# Patient Record
Sex: Male | Born: 1965 | Race: White | Hispanic: No | Marital: Single | Smoking: Current every day smoker
Health system: Southern US, Community
[De-identification: ages and names within clinical notes are randomized; demographics above are authoritative.]

## PROBLEM LIST (undated history)

## (undated) DIAGNOSIS — R569 Unspecified convulsions: Secondary | ICD-10-CM

## (undated) DIAGNOSIS — S0990XA Unspecified injury of head, initial encounter: Secondary | ICD-10-CM

## (undated) HISTORY — DX: Unspecified convulsions: R56.9

## (undated) HISTORY — PX: OTHER SURGICAL HISTORY: SHX169

## (undated) HISTORY — DX: Unspecified injury of head, initial encounter: S09.90XA

## (undated) HISTORY — PX: TONSILLECTOMY: SUR1361

## (undated) HISTORY — PX: HERNIA REPAIR: SHX51

---

## 2000-12-04 ENCOUNTER — Encounter: Payer: Self-pay | Admitting: Dentistry

## 2000-12-05 ENCOUNTER — Ambulatory Visit (HOSPITAL_COMMUNITY): Admission: RE | Admit: 2000-12-05 | Discharge: 2000-12-05 | Payer: Self-pay | Admitting: Dentistry

## 2001-11-05 ENCOUNTER — Encounter: Admission: RE | Admit: 2001-11-05 | Discharge: 2002-02-03 | Payer: Self-pay

## 2002-02-04 ENCOUNTER — Encounter: Admission: RE | Admit: 2002-02-04 | Discharge: 2002-04-02 | Payer: Self-pay | Admitting: Anesthesiology

## 2002-04-23 ENCOUNTER — Encounter: Admission: RE | Admit: 2002-04-23 | Discharge: 2002-07-22 | Payer: Self-pay | Admitting: Anesthesiology

## 2002-08-20 ENCOUNTER — Encounter: Admission: RE | Admit: 2002-08-20 | Discharge: 2002-11-18 | Payer: Self-pay

## 2002-11-25 ENCOUNTER — Encounter
Admission: RE | Admit: 2002-11-25 | Discharge: 2003-02-23 | Payer: Self-pay | Admitting: Physical Medicine & Rehabilitation

## 2003-03-13 ENCOUNTER — Encounter
Admission: RE | Admit: 2003-03-13 | Discharge: 2003-06-11 | Payer: Self-pay | Admitting: Physical Medicine & Rehabilitation

## 2003-07-11 ENCOUNTER — Encounter
Admission: RE | Admit: 2003-07-11 | Discharge: 2003-10-09 | Payer: Self-pay | Admitting: Physical Medicine & Rehabilitation

## 2003-10-20 ENCOUNTER — Encounter
Admission: RE | Admit: 2003-10-20 | Discharge: 2004-01-18 | Payer: Self-pay | Admitting: Physical Medicine & Rehabilitation

## 2006-06-27 ENCOUNTER — Emergency Department (HOSPITAL_COMMUNITY): Admission: EM | Admit: 2006-06-27 | Discharge: 2006-06-27 | Payer: Self-pay | Admitting: Emergency Medicine

## 2010-03-22 ENCOUNTER — Emergency Department (HOSPITAL_COMMUNITY): Admission: EM | Admit: 2010-03-22 | Discharge: 2010-03-22 | Payer: Self-pay | Admitting: Emergency Medicine

## 2010-08-20 ENCOUNTER — Other Ambulatory Visit (HOSPITAL_BASED_OUTPATIENT_CLINIC_OR_DEPARTMENT_OTHER): Payer: Self-pay | Admitting: Internal Medicine

## 2010-08-20 ENCOUNTER — Ambulatory Visit (HOSPITAL_BASED_OUTPATIENT_CLINIC_OR_DEPARTMENT_OTHER)
Admission: RE | Admit: 2010-08-20 | Discharge: 2010-08-20 | Disposition: A | Discharge status: Home / Self Care | Payer: Medicaid Other | Source: Ambulatory Visit | Attending: Internal Medicine | Admitting: Internal Medicine

## 2010-08-20 DIAGNOSIS — R109 Unspecified abdominal pain: Secondary | ICD-10-CM

## 2010-08-20 DIAGNOSIS — R10A1 Flank pain, right side: Secondary | ICD-10-CM

## 2010-09-16 LAB — DIFFERENTIAL
Basophils Absolute: 0 10*3/uL (ref 0.0–0.1)
Basophils Relative: 0 % (ref 0–1)
Eosinophils Absolute: 0.1 10*3/uL (ref 0.0–0.7)
Eosinophils Relative: 2 % (ref 0–5)
Lymphocytes Relative: 23 % (ref 12–46)
Monocytes Absolute: 0.6 10*3/uL (ref 0.1–1.0)

## 2010-09-16 LAB — BASIC METABOLIC PANEL
BUN: 8 mg/dL (ref 6–23)
CO2: 28 mEq/L (ref 19–32)
Calcium: 9.2 mg/dL (ref 8.4–10.5)
Chloride: 104 mEq/L (ref 96–112)
Creatinine, Ser: 0.81 mg/dL (ref 0.4–1.5)
GFR calc Af Amer: >60 mL/min (ref >60)
GFR calc non Af Amer: >60 mL/min (ref >60)
Glucose, Bld: 118 mg/dL — ABNORMAL HIGH (ref 70–99)
Potassium: 4.1 mEq/L (ref 3.5–5.1)
Sodium: 141 mEq/L (ref 135–145)

## 2010-09-16 LAB — CBC
HCT: 45.1 % (ref 39.0–52.0)
MCH: 34.9 pg — ABNORMAL HIGH (ref 26.0–34.0)
MCHC: 34.7 g/dL (ref 30.0–36.0)
MCV: 100.7 fL — ABNORMAL HIGH (ref 78.0–100.0)
Platelets: 253 10*3/uL (ref 150–400)
RDW: 13.9 % (ref 11.5–15.5)
WBC: 7.5 10*3/uL (ref 4.0–10.5)

## 2010-09-16 LAB — ETHANOL: Alcohol, Ethyl (B): 27 mg/dL — ABNORMAL HIGH (ref 0–10)

## 2010-09-16 LAB — RAPID URINE DRUG SCREEN, HOSP PERFORMED
Barbiturates: NOT DETECTED
Benzodiazepines: NOT DETECTED

## 2010-09-16 LAB — TRICYCLICS SCREEN, URINE: TCA Scrn: NOT DETECTED

## 2010-11-19 NOTE — Consult Note (Signed)
NAME:  Brian Riggs, Brian Riggs                           ACCOUNT NO.:  [id number]   MEDICAL RECORD NO.:  [id number]                    PATIENT TYPE:   LOCATION:                                       FACILITY:   PHYSICIAN:  Hans C. Stevphen Rochester, M.D.                DATE OF BIRTH:   DATE OF CONSULTATION:  DATE OF DISCHARGE:                                   CONSULTATION   HISTORY OF PRESENT ILLNESS:  [facility].  I evaluated the health and history form and 14-point  review of systems.  His mother is present.  We sent a considerable period of  time discussing treatment limitations and options, reviewing medications,  risks of medications, proper application, and directed care approach of  medications, and distributed materials.  Nursing is involved in discussions  as well.   1. Originally scheduled for C2 block.  I do think it is a useful procedure     to decrease frequency, intensity and duration of headaches, with anti-     efficacy over occipital nerve block.  Our rationale would be to move to     radiofrequency neuroablation if he had significant improvement.  2. I am going to hold off doing the block as he recently had an inguinal     hernia repair, and manage his pain with Duragesic.  His mother is stating     that he is having difficulty with breakthrough pain.  The Demerol does     not control his pain.  He has to take to the bed, and he has excruciating     pain.  Perhaps a pharmacokinetically longacting drug would be helpful in     this regard and this is reviewed with him.  I want to avoid escalation of     narcotic based pain medication and this might be a reasonable strategy.   Our hopes are eventually to move to non-narcotic medication alternatives or  p.r.n. medication, but I think from a dual purpose, this will help his  inguinal pain, as well as his headache pain which seems to be amplified  after his surgical procedure.  He has no  advancing neurological features, do  not believe further imaging or diagnostics warranted. I do not believe  further consultants are warranted.   OBJECTIVE:  Diffuse paracervical myofascial discomfort, impaired flexion,  extension, lateral rotational pain, positive cervical facetal compression  test left greater than right.  He has no overt neurological deficit, motor,  sensory or reflexes.   IMPRESSION:  1. Cervicalgia.  2. Degenerative spinal disease of cervical spine.  3. Cervical facet syndrome.  4. C2 cephalgia.   PLAN:  As outlined above.  Discharge instructions given.  Review of  Duragesic.  We will see him in one month.  Hans C. Stevphen Rochester, M.D.    [facility]  D:  04/02/2002  T:  04/02/2002  Job:  [id number]

## 2010-11-19 NOTE — Assessment & Plan Note (Signed)
ACUPUNCTURE FOLLOW-UP TREATMENT   SUMMARY:  Current pain level 8/10, going from 5-8. This is the sixth  acupuncture treatment.  Last treatment July 31, 2003.  The patient placed  in supine position.  Needles placed at bilateral LR3, LR8, LR14, as well as  bilateral GB18 and DU20; in addition, GB39 was utilized bilaterally.  Electrical stimulation between LR3 and LR8 as well as between LR14 and GB18.  Electrical stimulation at 150 Hertz x20 minutes was administered.  The  patient tolerated the procedure well.  Post procedure instructions given.  The patient will return in 2 weeks and look at effect of the acupuncture  intervention.      Erick Colace, M.D.   [facility]  D:  08/07/2003 14:08:00  T:  08/07/2003 14:48:44  Job #:  [id number]
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
## 2010-11-19 NOTE — Procedures (Signed)
NAME:  Brian Riggs, Brian Riggs                         ACCOUNT NO.:  [id number]   MEDICAL RECORD NO.:  [id number]                   PATIENT TYPE:  REC   LOCATION:  [facility]                                  FACILITY:  [facility]   PHYSICIAN:  Erick Colace, M.D.           DATE OF BIRTH:  Sep 23, 1965   DATE OF PROCEDURE:  DATE OF DISCHARGE:                                 OPERATIVE REPORT   HISTORY:  [name] returns today for his fourth acupuncture treatment.  He notes a pain level around 7-8/10 this morning.  He took a 1/2 of a  Vicodin.   MEDICATIONS:  1. Gabitril 12 b.i.d.  2. Lidocaine drops 2 drops each nostril t.i.d.  3. Duragesic patch 50 mcg q.72h.  4. Topamax 50 b.i.d.  5. Vicodin 1-1/2 tablets p.o. daily.   TREATMENT:  Treatments today consisted of points bilateral LR3, LR8, LR14,  GB1, as well as GB39.  Electrical stimulation between LR3 and LS14 as well  as between LR14 and GB1.  Treatment time 20 minutes.  Additional point  placed DU20.  The patient tolerated the procedure well. Discussed patient  with his mother.  We will schedule for two more treatments.                                                Erick Colace, M.D.    [facility]  D:  07/28/2003 11:36:50  T:  07/28/2003 12:22:31  Job:  [id number]
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
## 2010-11-19 NOTE — Assessment & Plan Note (Signed)
REASON FOR EVALUATION:  [name] returns after botulinum injection in  bilateral temporalis and posterior cervical musculature.  He had no  beneficial effect from his injection per his report as well as per his  mother's report.  He still continues to have pain mainly at the very top of  his head.  He grades the pain currently as an 8/10, averaging 7.  Interference scores:  Enjoying activity -- 7, mood -- 8, walking ability --  4, __________ -- 7, relationship with other people -- 6, sleep -- 7,  enjoyment of life -- 6.   MEDICATIONS:  Medications include:  1. Amantidine 2 mg t.i.d.  2. Aricept 10 mg nightly.  3. Zoloft 50 mg b.i.d.  4. Gabitril 12 mg b.i.d.  5. Lidocaine 4% -- two drops in each nostril t.i.d.  6. Prilosec 20 mg p.o. daily.  7. Duragesic patch 50 mcg q.72 h.  8. Vicodin one and a half tabs p.o. daily.  9. Topamax 50 mg p.o. b.i.d.   PAST MEDICAL HISTORY:  The patient did reportedly fracture or crack his  left forearm and from the mother's description, it sounds like an ulnar  greenstick-type of fracture; I do not have x-rays or any medical records.  He was treated locally in [place] at [facility].  Other past  medical history is significant for traumatic brain injury and chronic left  hemiparesis.   REVIEW OF SYSTEMS:  No suicidal ideation.  No bowel or bladder incontinence.   EXAMINATION:  GENERAL:  In no acute distress.  VITAL SIGNS:  Blood pressure 120/70, pulse 96, O2 saturation 98%.  NEUROMUSCULAR:  Motor strength is 5/5 in the right deltoid, biceps and grips  as well as hip flexion, knee extension and ankle dorsiflexor.  On the left,  he is 4/5 in these same muscle groups.  Cerebellar testing:  Finger-to-nose-  to-finger intact.  There is mild dysdiadochokinesia in the left upper  extremity supination, pronation and rapid alternating.  Cranial nerves show  an intranuclear ophthalmoplegia, no nystagmus.  Otherwise, cranial nerves  intact.   No tenderness to palpation over the temporal or posterior cervical  musculature.  He has full neck range of motion.   IMPRESSION:  Posttraumatic headaches with history of traumatic brain injury.   RECOMMENDATIONS:  1. Given that botulinum toxin was not helpful, would not benefit from     reinjection.  2. Discussed other alternative treatments including acupuncture.  The     patient's mother is willing to perform this as this is not covered by the     insurance.  I would do a trial of two times a week x3 weeks to evaluate     efficacy.  3. I will schedule this in January.  Continue current medications including     Duragesic 50 mcg q.72 h., continue lidocaine 4% nasal drops two drops,     each nostril, t.i.d.      Erick Colace, M.D.   [facility]  D:  06/09/2003 13:40:47  T:  06/09/2003 15:19:04  Job #:  [id number]   cc:   Sharyne Peach  [address]  [place]  [place] [address]  Fax: [phone]   Ezzard Flax  [address] [place]  [place] [address]  Fax: [phone]   Dr. Maple Hudson

## 2010-11-19 NOTE — Assessment & Plan Note (Signed)
REASON FOR EVALUATION:  Mr. Senske returns today after I last saw him for  acupuncture treatment, August 07, 2003.  He had some minimal improvements  in his headache pain after that puncture treatment.  He had 6 visits.  He  was not able to reduce his p.r.n. medication at all.  He states this pain  goes from 7-9 out of 10.  Pain areas are at the top of the head, right  shoulder and low back.  He has had no new medical complications in the last  2 weeks.  Other modalities tried are botulinum toxin injections, temporalis  as well as posterior cervical musculature on April 28, 2003.  His best  relief has been from sphenopalantine ganglion block that he had x2 in June  of 2004.   CURRENT MEDICATIONS:  1. Duragesic patch 50 mcg q.3 d.  2. __________  1 tab daily and it is 1 mg.  3. Aricept 10 mg p.o. daily.  4. Gabitril 1 p.o. b.i.d.  5. Zoloft 50 mg p.o. b.i.d.  6. Hydrocodone 5/500 mg 1 p.o. daily.  7. Omeprazole 20 mg p.o. daily.  8. He is also on lidocaine drops, 2 drops in each nostril t.i.d.   Duragesic:  He still has 16 days left.  He is current on his hydrocodone  utilization.   REVIEW OF SYSTEMS:  No pain doing down the arms.  No nausea or vomiting.   EXAMINATION:  GENERAL:  In no acute distress, mood and affect flat.  NEUROMUSCULAR:  Had no tenderness to palpation along the temporal muscle or  along the posterior occipital area.  No pain along the neck.  His cranial  nerves II-XII are intact.  His strength is 5/5 in bilateral upper and lower  extremities.  His neck has full range of motion without pain.   IMPRESSION:  1. Posttraumatic headaches.  2. Cervicalgia, mild.  This is less a problem than his headaches.   PLAN:  1. We will send him back to Dr. Celene Kras for repeat sphenopalatine     ganglion block.  I really think that that has been the most useful     modality for this patient and this may require repeat treatments every 4-     6 months.  2. Continue  Duragesic 50 mcg q.3 d.  3. Lidocaine drops -- 2 drops 3 times a day.  4. Hydrocodone 5/500 mg 1 p.o. daily p.r.n.  5. __________  4 mg daily.  6. Gabitril 4 mg b.i.d.  7. I will see him back in about a month or after 6 weeks to see how he does     after the sphenopalatine block.      Erick Colace, M.D.   [facility]  D:  08/25/2003 14:50:31  T:  08/25/2003 15:29:57  Job #:  [id number]
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
## 2010-11-19 NOTE — Procedures (Signed)
NAME:  Brian Riggs, Brian Riggs                         ACCOUNT NO.:  [id number]   MEDICAL RECORD NO.:  [id number]                   PATIENT TYPE:  REC   LOCATION:  [facility]                                  FACILITY:  [facility]   PHYSICIAN:  Erick Colace, M.D.           DATE OF BIRTH:  [birth year]   DATE OF PROCEDURE:  07/14/2003  DATE OF DISCHARGE:                                 OPERATIVE REPORT   HISTORY:  Post-traumatic headaches, history of a traumatic brain injury.   PROCEDURE:  Acupuncture treatment.   PHYSICIAN:  Erick Colace, M.D.   INFORMED CONSENT:  An informed consent was obtained, after explaining the  risks and benefits of the procedure with the patient.  A brief pain  inventory completed.  Current pain rating was 8/10, ranging from 7 to 9.  The pain __________ score showed activity 8, mood 7, walking ability 5,  normal of 7, relationship to other people 5, sleep 9, enjoyment of life 7.  The pain is mainly located at the top of the head, that is the vertex.   DESCRIPTION OF PROCEDURE:  The treatment today consisted of acupuncture  needle 0.22 mm width, 40 mm length inserted into DU-20 bilateral LR-3,  bilateral GB-39, bilateral LR-8, as well as bilateral LI-4.  The needles  were manually stimulated.  The infrared heating lamp utilized over bilateral  feet.  Treatment time was 20 minutes.  The patient tolerated the procedure  well.   DISPOSITION:  He is to return later this week for repeat treatment.  Consider the addition of LR-14 and GB-1, and the addition of electrical  stimulation between LR-3 and LR-8.                                                Erick Colace, M.D.    [facility]  D:  07/14/2003 15:22:37  T:  07/14/2003 16:02:20  Job:  [id number]

## 2010-11-19 NOTE — Consult Note (Signed)
NAME:  Brian Riggs, Brian Riggs                         ACCOUNT NO.:  [id number]   MEDICAL RECORD NO.:  [id number]                   PATIENT TYPE:  REC   LOCATION:  [facility]                                  FACILITY:  [facility]   PHYSICIAN:  Zachary George, DO                      DATE OF BIRTH:  05-Jun-1966   DATE OF CONSULTATION:  08/22/2002  DATE OF DISCHARGE:                                   CONSULTATION   [facility]:   HISTORY OF PRESENT ILLNESS:  Mr. Garate returns to clinic today for  reevaluation.  He was last seen on 05/21/02 by Dr. Stevphen Rochester.  He continues to  complain of severe headaches which are somewhat improved with Duragesic 25  mcg q.72h with Hydrocodone 5 mg/325 mg once per day as needed for pain not  controlled with the Duragesic.  The patient also continued on tizanidine,  Topamax and Gabatril.  He has undergone C2 ganglion injection without  relief.  His pain today is an 8 to 9/10 on subjective scale.  He was  previously followed by Dr. Maple Hudson, neurologist in [facility].  Mr. Fitzgerald is  accompanied by his mother.  We discussed his headaches again today.  The  patient's mother states that Dr. Maple Hudson had recommended referral to [facility] for further evaluation.  At this point I do not  have much more to offer Mr. Llorente in terms of pain control.  I think that  he is doing fairly well with the Duragesic and Hydrocodone and is not  experiencing any cognitive deficits.  I am reluctant to increase the doses  of the narcotics secondary to patient's history of traumatic brain injury,  and concerned that increased dose of narcotics may decrease his cognitive  level to some degree.  I discussed this with him and his mother at length.  They are in agreement.  I agree with Dr. Maple Hudson that Mr. Sangha needs to  have further evaluation in regards to his headaches.  I reviewed the health  and history form of 14 point review of systems.   PHYSICAL EXAMINATION:  VITAL SIGNS: Blood pressure is 124/81, pulse 88,  respirations 16, O2 saturation 98% on room air.  NEUROLOGIC EXAMINATION: Is intact in the upper extremities including motor,  sensory, and reflexes.  There is a trigger point in the right suboccipital  muscles which refers pain into the parietal region but not reproducing  patient's headaches.   IMPRESSION:  Headache.   PLAN:  1. I discussed treatment options with Mr. Tibbetts and his mother.  At this     point we will continue with Duragesic 25 mcg patches q.72 hours #10     without refills.  The patient brought his patches today, his used and     unused patches for count.  He has six patches remaining.  He is not to     get his new prescription filled until on or after 09/08/02.  2. Continue Hydrocodone as needed for breakthrough pain daily, patient has     18 of 30 pills remaining from previous prescription.  No new prescription     needed today.  3. Continue Tizanidine, Gabatril and Topamax.  4. Will refer to [facility] neurology department for another opinion     and possible further work up for etiology and treatment of headache.  5. The patient is to return to clinic in one month for reevaluation.   The patient was educated of above findings and recommendations and  understands.  There were no barriers to education.                                                Zachary George, DO    JW/MEDQ  D:  08/22/2002  T:  08/23/2002  Job:  [id number]

## 2010-11-19 NOTE — Op Note (Signed)
   NAME:  Brian Riggs                         ACCOUNT NO.:  [id number]   MEDICAL RECORD NO.:  [id number]                   PATIENT TYPE:  REC   LOCATION:  [facility]                                  FACILITY:  [facility]   PHYSICIAN:  Celene Kras, MD                     DATE OF BIRTH:  1965/09/11   DATE OF PROCEDURE:  DATE OF DISCHARGE:                                 OPERATIVE REPORT   [facility] I evaluated him  via history and performed 14 point review of systems. He is accompanied with  his care Brian Riggs.   1. We have seen a decrease in frequency, intensity and duration of headaches     after sphenopalatine ganglion block, and I am going to go ahead and     proceed with the second in a series. I am then going to go ahead and     prescribe lidocaine drops, and I reviewed these with he and his care     Brian Riggs. He will use these sparingly and I reviewed the use of these     medications within the context of enhanced function and quality of life     indices.   1. I do not believe further imaging or diagnostics are warranted.   1. Lifestyle enhancements discussed such as cigarette cessation, and home     based therapy.   I will follow within 1-2 months or p.r.n. if he is doing well. They had many  questions about acupuncture and I will refer to Dr. Erick Riggs in  this regard.   He is consented for today's procedure.   Objectively, no significant change in neurologic or musculoskeletal  presentation.   IMPRESSION:  Headaches, posttraumatic cervicogenic headache. Migraine  headache unspecified.   PLAN:  Sphenopalatine ganglion block, right and left side independent access  points, is cycled.   The patient taken to the procedure, placed in the semi-Fowler's position.  Using lidocaine silk pledgets, we advanced to the posterior nares right and  left side, two pledgets each side, cycled x2, 15 minutes each cycle. He  tolerated this procedure well with no complications from our procedure.  Discharge instructions given.  Appropriate recovery period.  Improved at  discharge.  __________ within the context of activities of daily living.                                               Celene Kras, MD    HH/MEDQ  D:  12/31/2002  T:  12/31/2002  Job:  [id number]

## 2010-11-19 NOTE — Assessment & Plan Note (Signed)
HISTORY:  The patient returns today after he saw Dr. Celene Kras on August 26, 2003.  He had a sphenopalatine ganglion block, transmucosal with cocaine  10%.  He has not noted any significant changes.  No significant changes with  his headache pain, which he describes as being at the top of his head, but  inside his skull.  He was changed from Vicodin to Durabac , and the patient  states that this works as well as the Vicodin.  He has been maintained on  Duragesic patch 50 mcg for some time, and then switched to the generic  version, and he is not sure that it works quite as well as the other version  for him.  No new interval medical history.  His pain scores are still in the 8-9 range.  This is mainly his head, but he  does note some right shoulder pain at times, and some back pain.   MEDICATIONS:  1. Aricept 10 mg daily at bedtime.  2. Gabitril 12 mg p.o. b.i.d.  3. Tizanidine 2 mg t.i.d.  4. Omeprazole 20 mg daily.  5. Durabac one tab t.i.d.  6. Zoloft 50 mg b.i.d.  7. Duragesic 50 mcg q.3 days.  8. Topamax 50 mg b.i.d.   PHYSICAL EXAMINATION:  VITAL SIGNS:  Blood pressure 114/77, pulse 90,  respirations 16, O2 saturation 100% on room air.  GENERAL:  He has a normal affect, alert, appearance normal.  NECK:  He has a full range of motion.  The neck otherwise had no pain to  palpation.  NEUROLOGIC:  He has normal upper extremity strength.  Diminished deep tendon  reflexes bilaterally.  He has full extraocular motion.  He has normal  sensation in the facial region.  He protrudes the tongue in the midline.  Face is symmetric.  Mild tenderness to palpation at the base of the right  occiput.   IMPRESSION:  Post-traumatic headache, history of severe traumatic brain  injury.   PLAN:  1. Will try reducing the Duragesic dosage to 25 mcg.  It is not clear     whether this is actually helping him, and we will give a trial of a     reduced dose to see if symptoms are aggravated.  If  so, we can go back up     to the 50 mcg.  2. I will see him back in approximately one month.  3. Continue the Durabac.  He is on additional medications that may impact on     headache pain such as Gabitril, tizanidine, and Topamax.      Erick Colace, M.D.   AEK/MedQ  D:  10/21/2003 10:00:59  T:  10/21/2003 10:51:07  Job #:  [id number]   cc:   Sharyne Peach  [address]  [place]  [place] [address]  Fax: [phone]
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
## 2010-11-19 NOTE — Assessment & Plan Note (Signed)
FOLLOW UP:  [facility] to  evaluate him, review health and history form, and 14-point review of  systems.   PROBLEM LIST:  1. Brian Riggs comes in today and is not accompanied by his mother.  We performed     an inventory of his medications.  He states he eating Vicodin.  We     cautioned as to over use and his mother will be following his medication     usage.  2. He requested OxyContin.  I do not think it is clinically in his best     interest to be on this medication.  Duragesic delivery system is superior     due to issues of medication usage patterns.  3. This is another issue.  He has not had any Duragesic since January and he     states that he is about out.  I am not sure what that means.  We are     going to go ahead and do a pharmacy check and we will try to contact his     mother.  He should be out and he does have a patch on and prior to     writing any prescription we are going to need to know where these patches     are going.  He may not even be benefiting from narcotic-based pain     medication and it is another risk entity and would benefit from moving to     non-narcotic medication alternatives, reinforcing lifestyle enhancement     such as cigarette cessation, increasing his activity from a sedentary     lifestyle.   OBJECTIVELY:  Diffuse paracervical suprascapular myofascial discomfort and  positive cervical facetal compression, right greater than left.  This has  not changed.  He has no new neurological findings, motor or sensory  reflexes.   IMPRESSION:  1. Cervicalgia and cervicogenic headaches.  2. Posttraumatic headaches.   PLAN:  I will hold off doing his sphenopalatine ganglion block as he only  had a couple of days relief each time.  He is possibly an injection  candidate, were we to even consider neural ablation but that is pretty  dramatic step.  If we could control him with conservative management, this  is  far superior.  We will probably move away from controlled substances.  We  are doing some background checking and try to figure out where his patches  are at.  We are not going to be prescribing anything until we have a good  ideal here. He is seeing Dr. Erick Colace in the interim and maybe we  just do not have it documented to the chart.  His mother will let some light  on this.  Follow up in one to two months.  Discharge instructions given.      Celene Kras, MD   HH/MedQ  D:  09/16/2003 10:03:27  T:  09/16/2003 10:51:19  Job #:  [id number]

## 2010-11-19 NOTE — Op Note (Signed)
[place]  Patient:    Brian Riggs, Brian Riggs                      MRN: [id number] Proc. Date: 12/05/00 Adm. Date:  [id number] Attending:  Mohorn, Elmon Else                           Operative Report  PREOPERATIVE DIAGNOSES: 1. Impacted third molars, 1, 16, 17, and 32. 2. Long-term idiopathic headaches.  POSTOPERATIVE DIAGNOSES: 1. Impacted third molars, 1, 16, 17, and 32. 2. Long-term idiopathic headaches.  PROCEDURE:  Extraction of completely impacted teeth #1, 16, 17, and 32.  SURGEON:  Cherly Anderson, D.D.S.  ANESTHESIA:  General anesthesia.  INDICATIONS FOR PROCEDURE:  Brian Riggs was referred to our office on recommendation from the physicians managing his recurring headaches, who had requested removal of impacted third molars as a possible source of his recurring headaches.  I discussed with [name] and his mother that although there is a small possibility that the impacted third molars are causing the headaches, at his age I am doubtful that removal of the impacted third molars will relieve his symptoms.  Understanding this and following discussion of all the potential complications regarding extraction of impacted third molars, including temporary/permanent paresthesia, swelling, infection, pain, both Talbert and his mother elected to proceed with extraction of all four completely impacted third molars.  DESCRIPTION OF PROCEDURE:  The patient was brought to the operating room, placed in the supine position.  Once general anesthesia was induced via nasotracheal intubation, the patient was prepped and draped for a maxillofacial procedure of this type.  Initially, local anesthesia was injected in all four quadrants of the oral cavity consisting of 9 cc of 2% lidocaine with 1:100,000 epinephrine.  Initially attention was directed toward removal of teeth #16 and 17.  A 15 blade was used to prepare the mucoperiosteal flap over the site of the  impacted teeth.  Regarding tooth #17, a rotary osteotome with normal saline irrigation was utilized to remove bone and section the tooth.  The tooth was delivered without complications and no excessive bleeding.  The extraction site was closed with 3-0 chromic suture. A mucoperiosteal flap was reflected over tooth #16, and bone was removed with rongeurs.  This tooth was mobilized and delivered without complications, and the soft tissue was closed with 3-0 chromic suture.  Attention was then directed toward removal of teeth #1 and 32.  A mucoperiosteal flap was reflected over tooth #32, and a rotary osteotome was utilized to carefully remove bone over the impacted tooth.  The tooth was carefully sectioned with copious amounts of normal saline irrigation, and the tooth was removed without complications.  The bottom of the socket of tooth #32 was evaluated, and the inferior alveolar neurovascular bundle could be visualized; however, it was intact and did not appear to be bleeding significantly.  The site was irrigated with normal saline and closed with 3-0 chromic suture.  A mucoperiosteal flap was reflected over impacted tooth #1. Bone was removed with rongeurs, and the tooth was mobilized and extracted without complications.  This site was also closed with 3-0 chromic suture.  The procedure was essentially concluded.  The oral cavity was suctioned dry and evaluated for hemostasis.  No significant bleeding was noted.  The throat pack was removed, and gauze was placed over extraction sites to facilitate hemostasis.  The patient was allowed to  awaken and extubated without complications and transferred to recovery room in stable, satisfactory condition. DD:  12/05/00 TD:  12/05/00 Job: [id number]

## 2010-11-19 NOTE — Procedures (Signed)
NAME:  Brian Riggs, Brian Riggs                         ACCOUNT NO.:  [id number]   MEDICAL RECORD NO.:  [id number]                   PATIENT TYPE:  REC   LOCATION:  [facility]                                  FACILITY:  [facility]   PHYSICIAN:  Erick Colace, M.D.           DATE OF BIRTH:  07/07/1965   DATE OF PROCEDURE:  07/31/2003  DATE OF DISCHARGE:                                 OPERATIVE REPORT   REASON FOR CONSULTATION:  The patient returns today for an acupuncture  treatment after last being seen on July 28, 2003.  Treatment at that time  consisted of points LR3, LR8, GB39, GB14, GB1 as well as DU20.  He has been  feeling better all over; however, his headache pain remains fairly constant.  He feels like his energy level is better.   Treatment today consists of bilateral points GB39, LR3, LR8, LR14, DU20 and  GB18.  Electrical stimulation between points LR3 and LR8 as well as between  GB14 and GB18 four hertz times 20 minutes.  The patient tolerated the  procedure well and will be seen back in one week.  I think that he is  starting to show improvement and once a week treatments would be indicated  for the next two to three weeks and then we hope to taper to every other  week.                                                Erick Colace, M.D.    AEK/MEDQ  D:  07/31/2003 13:57:36  T:  07/31/2003 14:33:59  Job:  [id number]   cc:   Sharyne Peach  [address]  [place]  [place] [address]  Fax: [phone]

## 2010-11-19 NOTE — Consult Note (Signed)
NAME:  Brian Riggs, Brian Riggs                         ACCOUNT NO.:  [id number]   MEDICAL RECORD NO.:  [id number]                   PATIENT TYPE:  REC   LOCATION:  [facility]                                  FACILITY:  [facility]   PHYSICIAN:  Hans C. Stevphen Rochester, M.D.                DATE OF BIRTH:  Apr 23, 1966   DATE OF CONSULTATION:  DATE OF DISCHARGE:                                   CONSULTATION   HISTORY OF PRESENT ILLNESS:  [facility].  I evaluated and reviewed the health and history form and  14-point review of systems.   1. We had considered a second CT nerve root injection, but I did not really     see any significant benefit.  He did not notice prolonged relief cycling     and only had modest decrease in frequency, intensity and duration of     headaches, but as he and his mother relate, his headaches were improved     with Duragesic as he noted when he took the Duragesic of, he had rebound.     I cautioned as to these medications, reviewed habituating nature,     reviewed the overall implications of these medications and they     understand.  2. I will add a breakthrough medication to be used sparingly and monitor     usage patterns.  3. I do not believe further imaging or diagnostics are warranted.  I do not     believe further interventional procedures would be warranted as well.     His mother is monitoring his medication.  They are appropriate.  They     bring the patches in for count.  We reviewed patient care agreement.  4. I have also discussed lifestyle enhancement, cigarette cessation and home     based therapy.  I emphasized this.  They query as to potential     investigations as to cervicogenic component, and I asked them to maintain     contact with primary care.  I see no reason to investigate any further as     there are no advancing neurological features, and his overall     presentation is stable.   OBJECTIVE:  Diffuse  paracervical suprascapular myofascial discomfort, intact  neurologically, motor, sensory, reflexes, and suboccipital compression test  positive.  No new features identified.   IMPRESSION:  1. Cervicogenic headache.  2. Degenerative spinal disease, cervical spine.  3. Poor overall health characteristics.  4. C2 cephalgia.   PLAN:  Conservative management. Discharge instructions given.  No barriers  to understanding.  Mother understands.  Patient understands and mother will  monitor usage patterns.  They will notify us for further see problems and  see [place] in two to three months.  The patient was instructed to maintain  contact with primary care.  Hans C. Stevphen Rochester, M.D.    [facility]  D:  05/21/2002  T:  05/21/2002  Job:  [id number]

## 2010-11-19 NOTE — Op Note (Signed)
   NAME:  Brian Riggs, Brian Riggs                         ACCOUNT NO.:  [id number]   MEDICAL RECORD NO.:  [id number]                   PATIENT TYPE:   LOCATION:                                       FACILITY:  [facility]   PHYSICIAN:  Celene Kras, MD                     DATE OF BIRTH:  1965/10/09   DATE OF PROCEDURE:  DATE OF DISCHARGE:                                 OPERATIVE REPORT   [facility].  I evaluated  him via health and history form and 14-point review of systems.   1. Brian Riggs comes in today and we plan a sphenopalatine ganglion block.  Risks,     complications, and options were reviewed with him and his caregiver, and     they understand that we are going to start today with local anesthetic,     consider adding a cocaine mixture at next visit.  Rationale is to     decrease frequency, intensity, and duration of headache, but clearly the     idea here is to help with wellness and progress forward in a functional     capacity.  We want to minimize escalation of narcotic-based pain     medication.  I performed an inventory in this regard, and he is     appropriate and being monitored by his caregiver.  2. Do not believe further consultants are necessary at this time.  3. We will go ahead and see him one week and predicate any further injection     based on frequency, intensity, and duration, relief cycling, and may even     consider giving him home lidocaine drops.   Objectively, no significant change in neurologic or musculoskeletal  presentation, with diffuse suprascapular, paracervical myofascial  discomfort, intact neurologically motor, sensory, reflexes.   IMPRESSION:  1. Cervicogenic headaches.  2. Post-traumatic headaches.  3. Headache, unspecified.   PLAN:  Sphenopalatine ganglion block.  He is consented.   Patient taken to the procedure area, appropriate monitors placed.  Using  lidocaine-soaked pledgets, we advanced to the  posterior nares and confirmed  placement.  This is two to each nostril, right and left side, in two cycles,  15 minutes each.   He had appropriate recovery period.  Lidocaine 2% NPF is used, no trauma  identified.  Somewhat improved at discharge, and his caregiver will help  with the satisfaction and functional inventory.  Discharge instructions  given.                                               Celene Kras, MD    HH/MEDQ  D:  12/24/2002  T:  12/25/2002  Job:  [id number]

## 2010-11-19 NOTE — Procedures (Signed)
NAME:  Brian Riggs, Brian Riggs                         ACCOUNT NO.:  [id number]   MEDICAL RECORD NO.:  [id number]                   PATIENT TYPE:  REC   LOCATION:  [facility]                                  FACILITY:  [facility]   PHYSICIAN:  Erick Colace, M.D.           DATE OF BIRTH:  April 09, 1966   DATE OF PROCEDURE:  07/21/2003  DATE OF DISCHARGE:                                 OPERATIVE REPORT   INDICATION FOR TREATMENT:  Posttraumatic headaches, chronic, unresponsive to  trigger point injections, only partially responsive to narcotic analgesics.   Treatment today consisted of bilateral points LR3, LR8, LR14, GB1 and LI4 as  well as DU20.  Electrical stimulation between LR3 and LR8 at 2 Hz x20  minutes.  Patient tolerated procedure well.  Next visit add GB39 and add  electrical stimulation between LR14 and GB1.                                                Erick Colace, M.D.    AEK/MEDQ  D:  07/21/2003 10:33:05  T:  07/21/2003 11:23:31  Job:  [id number]

## 2010-11-19 NOTE — Consult Note (Signed)
NAME:  Brian Riggs, Brian Riggs                         ACCOUNT NO.:  [id number]   MEDICAL RECORD NO.:  [id number]                   PATIENT TYPE:  REC   LOCATION:  [facility]                                  FACILITY:  [facility]   PHYSICIAN:  Zachary George, DO                      DATE OF BIRTH:  1965-07-24   DATE OF CONSULTATION:  10/03/2002  DATE OF DISCHARGE:                                   CONSULTATION   The patient returns to clinic today as scheduled for reevaluation.  He is  accompanied by his mother.  He continues to complain of constant daily  headaches which are improved to some degree with Duragesic 25 mcg q.72h.  He  also takes hydrocodone 5 mg very sparingly, typically averages out to be 1  pill daily.  He did get a neurology appointment at [facility], but his mother  states it is not until July 2004.  She does not notice any significant  changes in his neurologic function, although she has noticed some drooping  of his left eye which has occurred before.  He still rates his pain as a  9/10 on subjective scale.  I reviewed the health and history form and 14-  point Review of Systems.   He continues on Gabatril 4 mg b.i.d. and Tizanidine 2 mg 3 times per day as  well as Topamax 25 mg 2 tablets per day.  He is on Zoloft 50 mg 2 per day.   PHYSICAL EXAMINATION:  GENERAL:  Healthy-appearing male in no acute  distress.  VITAL SIGNS:  Blood pressure 108/70, pulse 73, respirations 16, O2  saturation 97% on room air.  NEUROLOGIC:  Mood and affect are depressed and flat, respectively.  Speech  is somewhat dysarthric without change.  He does have a very mild ptosis on  the left; however, I do not see any facial drooping and cranial nerve  examination is otherwise grossly intact.   IMPRESSION:  1. Headache.  2. Traumatic brain injury x 2.   PLAN:  1. Continue Duragesic 25 mg patches q.72h., #10 without refills.  This is to     be filled on or after 10/07/2002.  The patient has been tolerating this   medication well without any reported side effects.  2. Continue Norco 5 mg/325 mg 1 p.o. daily as needed for breakthrough pain,     #30 without refills.  3. Await neurology evaluation at [facility] [place], [place].  4. The patient is to return to clinic in one month or reevaluation or sooner     as needed.  His mother is instructed to monitor his ptosis, and if     symptoms seem to worsen, then he should report back to clinic or contact     his primary care Trayce Maino.   The patient was educated about findings and recommendations and understands.  No  barriers to communication.                                                Zachary George, DO    JW/MEDQ  D:  10/03/2002  T:  10/04/2002  Job:  [id number]
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
## 2010-11-19 NOTE — Op Note (Signed)
NAME:  Brian Riggs, Brian Riggs                         ACCOUNT NO.:  [id number]   MEDICAL RECORD NO.:  [id number]                   PATIENT TYPE:  REC   LOCATION:  [facility]                                  FACILITY:  [facility]   PHYSICIAN:  Hans C. Stevphen Rochester, M.D.                DATE OF BIRTH:  1966/02/09   DATE OF PROCEDURE:  04/23/2002  DATE OF DISCHARGE:                                 OPERATIVE REPORT   HISTORY OF PRESENT ILLNESS:  [facility]  I evaluate him via health and history form and 14-point  review of systems.   1. We are planning a C2 nerve root injection, bilateral right and left side,     indication for control of cervicogenic headache.  He does have C2     cephalgic component, and he will assess this within the context of     activities of daily living.  He will monitor frequency, intensity, and     duration of headaches.  This has been discussed with him in detail,     including his mother, who understands with no barriers to understanding.     She will also monitor usage of medications, and we will follow this as     well.  He is utilizing these medications appropriately, and I reviewed     this parameter.  2. The understand the risk of these medications and hope we can move to non-     narcotic medication alternatives over time to further improve the     likelihood of long-term symptom control with p.r.n. medication.  I think     we are slowly making progress.  Again, lifestyle enhancements are     discussed, such as cigarette cessation.   OBJECTIVE:  Diffuse paracervical myofascial discomfort.  Impaired flexion-  extension, lateral rotational pain.  Suboccipital compression test positive.  No new neurological findings motor, sensory, or reflexive.   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   PLAN:  C2 nerve root injection right and left side.  Consider neurotomy with  positive  provocative block.  Will monitor medication usage, as well as  parameters associated with this approach.  He is consented.   DESCRIPTION OF PROCEDURE:  The patient was taken to the fluoroscopy suite  and placed in the supine position and prepped and draped in the usual  fashion.  Using a 25-gauge needle, I advanced to the C2 nerve root on the  right and left side at independent needle access points.  I confirmed  placement in multiple fluoroscopic positions.  I used Isovue 200.  No CSF,  heme, or paresthesia identified at any point.  Test block uneventful.  Followed with 1 cc of lidocaine, 1% NPF, and 20 mg of Aristocort on the  right and left side.   He tolerated this procedure  well, no complications from our procedure.  Appropriate recovery period.  Improved at discharge.  Discharge instructions  given in detail.  Both patient and mother understand, and will see him in  follow-up.                                               Hans C. Stevphen Rochester, M.D.    [facility]  D:  04/24/2002  T:  04/24/2002  Job:  [id number]

## 2010-11-19 NOTE — Procedures (Signed)
NAME:  Brian Riggs, Brian Riggs                         ACCOUNT NO.:  [id number]   MEDICAL RECORD NO.:  [id number]                   PATIENT TYPE:  REC   LOCATION:  [facility]                                  FACILITY:  [facility]   PHYSICIAN:  Erick Colace, M.D.           DATE OF BIRTH:  23-Apr-1966   DATE OF PROCEDURE:  07/25/2003  DATE OF DISCHARGE:                                 OPERATIVE REPORT   Acupuncture treatment.   Pretreatment pain form current pain indicated over the head, 8/10, averaging  7 to 8.  Pain interference score is relatively 7, mood 6, walking ability 3,  relationship with other people 6, enjoyment of life 6.   Treatment today consisted of needles placed at bilateral LR3, LR8, LR14,  GB1, GB39, DU20.  Electrical stimulation between LR3 and LR8 bilaterally and  between LR14 and GB1 bilaterally at 2 Hz x20 minutes.  The patient tolerated  the procedure well.  He is scheduled to return next week.                                                Erick Colace, M.D.    AEK/MEDQ  D:  07/25/2003 10:13:17  T:  07/25/2003 10:44:15  Job:  [id number]   cc:   Sharyne Peach  [address]  [place]  [place] [address]  Fax: [phone]

## 2010-11-19 NOTE — Procedures (Signed)
NAME:  Brian Riggs, Brian Riggs                         ACCOUNT NO.:  [id number]   MEDICAL RECORD NO.:  [id number]                   PATIENT TYPE:  REC   LOCATION:  [facility]                                  FACILITY:  [facility]   PHYSICIAN:  Celene Kras, MD                     DATE OF BIRTH:  1965-09-09   DATE OF PROCEDURE:  08/26/2003  DATE OF DISCHARGE:                                 OPERATIVE REPORT   Verlon [facility] for scheduled  sphenopalatine ganglion block.  This will be by transmucosal approach.  He  is informed as to the use of cocaine and lidocaine mixture, equal parts, and  he is in a monitored environment with nursing present.  He has noted some  improvement with decreased frequency, intensity and duration of headaches.  This procedure is superior to more interventional procedures, which were met  with modest success, and I believe the risks will benefit clearly in his  favor.  Again cigarette cessation for best outcome, instructed to maintain  contact with primary care.  Another rational for performing this procedure  is to minimize escalation narcotic based pain medication.  We had been  fairly successful here, and his family members are monitoring his usage  patterns.   OBJECTIVE:  No significant change in neurological and musculoskeletal  presentation.  He is alert and engaging, oriented x3.  No other overt  neurological deficit identified.   IMPRESSION:  Headache, post traumatic.  C2 cephalgia.   PLAN:  Sphenopalatine ganglion block, transmucosal.  He has consented.   Using lidocaine, preservative-free, and cocaine 10%, equal parts, we advance  two pledgets to each nares, left for 10 minutes, removed atraumatically and  then repeated.  This is in a monitored environment with nursing  present.  Vital signs stable.  No complications identified.  Discharge instructions  given.  We will see him in follow-up. I do not necessarily plan  another  procedure.  I would like to see how he does with this.  Discharge  instructions given.                                                Celene Kras, MD    HH/MEDQ  D:  08/26/2003 09:33:40  T:  08/26/2003 10:22:20  Job:  [id number]

## 2010-11-19 NOTE — Consult Note (Signed)
   NAME:  Brian Riggs, Brian Riggs                         ACCOUNT NO.:  [id number]   MEDICAL RECORD NO.:  [id number]                   PATIENT TYPE:  REC   LOCATION:  [facility]                                  FACILITY:  [facility]   PHYSICIAN:  Hans C. Stevphen Rochester, M.D.                DATE OF BIRTH:  11-14-1965   DATE OF CONSULTATION:  DATE OF DISCHARGE:                           PAIN MANAGEMENT CONSULTATION   HISTORY OF PRESENT ILLNESS:  [facility].  I evaluated and reviewed the health and history form and  14-point review of systems.  His mother accompanies him, history from the  patient and his mother.  1. We have seen his headaches improve in frequency, intensity, and duration,     and Mepergan has helped this.  When he has a headache arrive, he takes     his medication, and this essentially relieves the symptoms with recovery     over a number of hours.  They are using this medication sparingly and     appropriately, with his mother monitoring this medication, and I see no     variation or improper usage.  2. I am going to go ahead and continue him on Phenergan and Demerol as this     seems to be keeping him from visiting the emergency department, improving     his functional indices and quality of life indices.  Predicate any     further treatment based on need.  Would also consider C2 block with     possible neurotomy or facetal injection if warranted down the road but,     at this time, we will take the conservative management position.   No significant change in character on presentation.   Objectively, no significant change in neurological or musculoskeletal  presentation.   IMPRESSION:  Headache.   PLAN:  As outlined above.  Discharge instructions given.  Review of  medication.  They understand the habituating nature of this medication, to  use sparingly and appropriately.  It will be used as a rescue.             Hans C. Stevphen Rochester, M.D.    [facility]  D:  02/05/2002  T:  02/09/2002  Job:  [id number]

## 2011-11-13 IMAGING — US US RENAL
1 series · 14 of 23 positions shown · non-contrast
Comparison: None

The *RADIOLOGY REPORT*
CLINICAL DATA: Right-sided pain for 3 days.  Negative urine
pregnancy test.

RENAL/URINARY TRACT ULTRASOUND COMPLETE

[Series 1: us renal · 0.20mm/px · 14 of 23 slices shown]
[im 1/23]
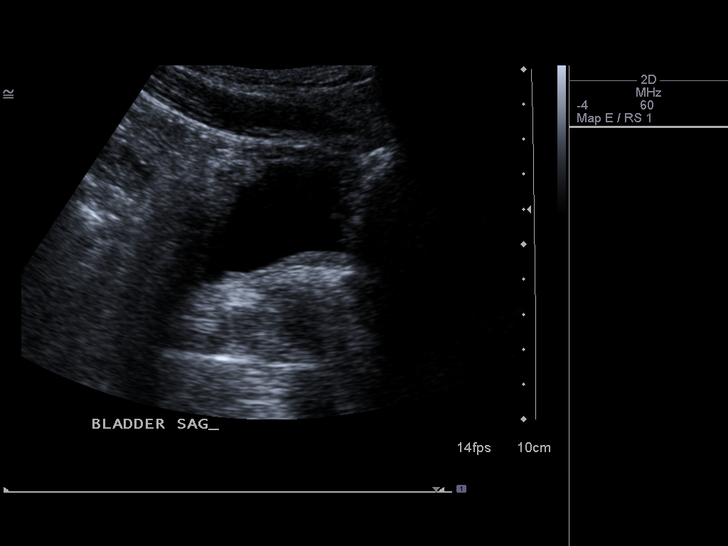
[im 3/23]
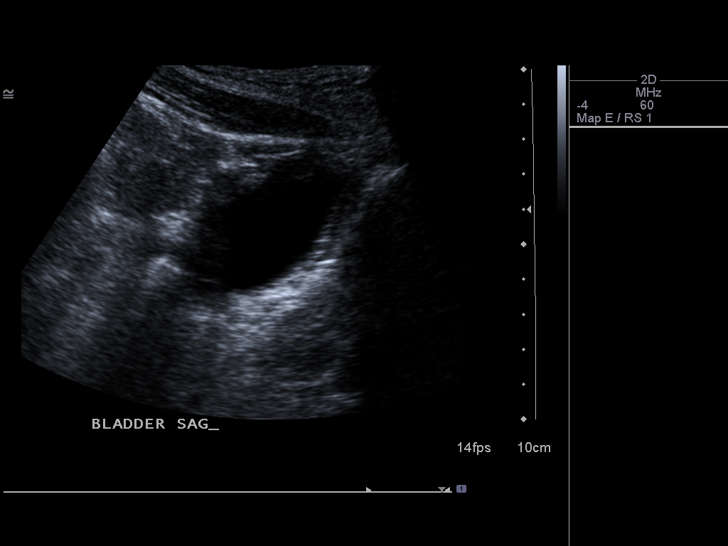
[im 5/23]
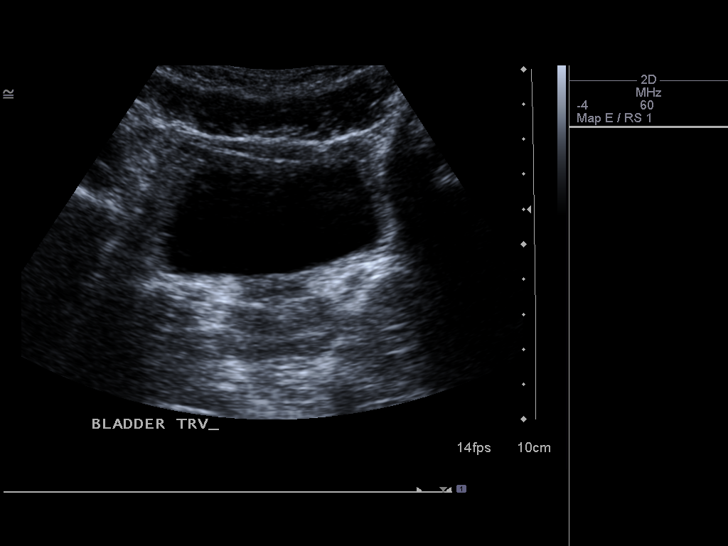
[im 6/23]
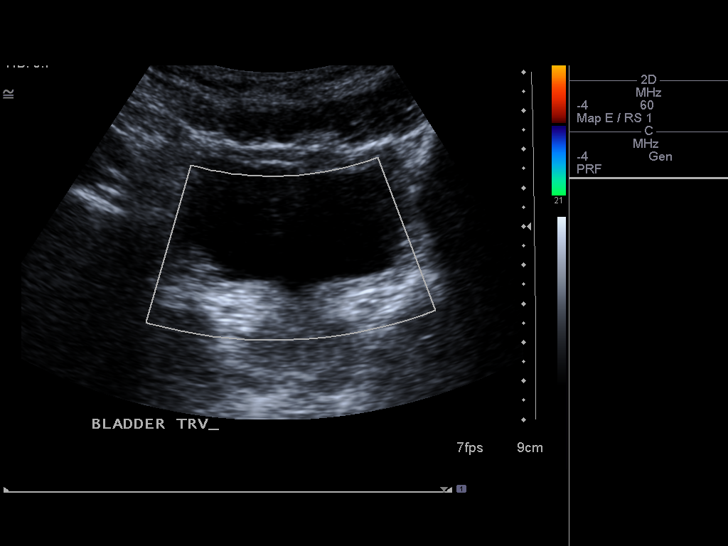
[im 8/23]
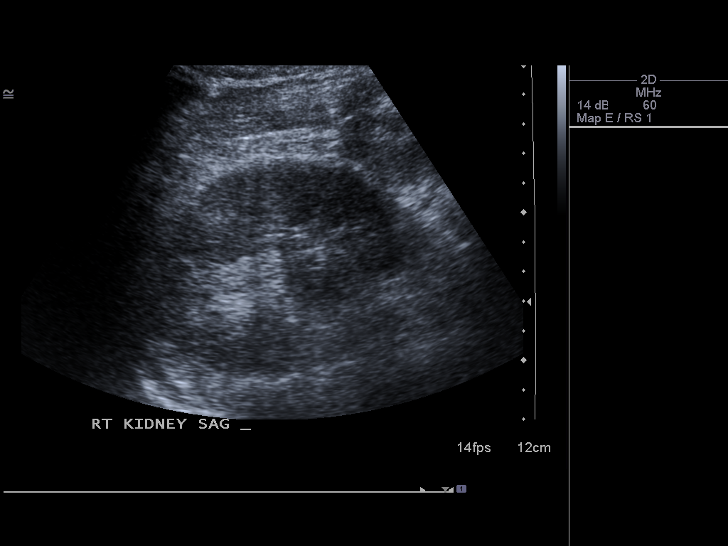
[im 10/23]
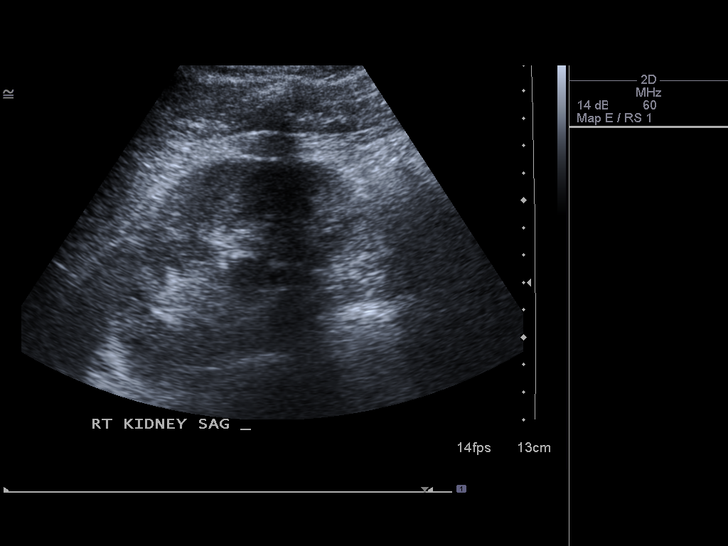
[im 11/23]
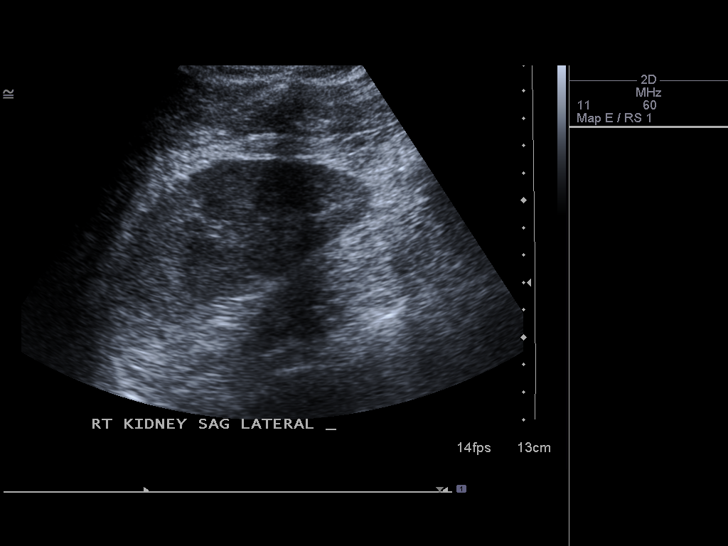
[im 13/23]
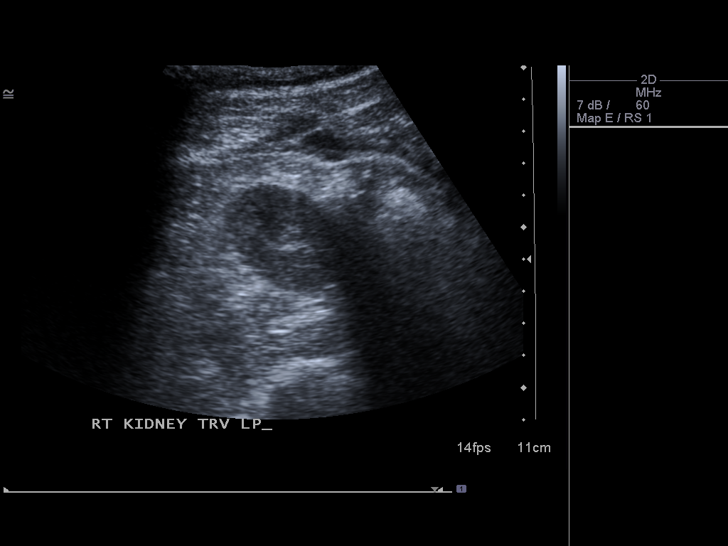
[im 14/23]
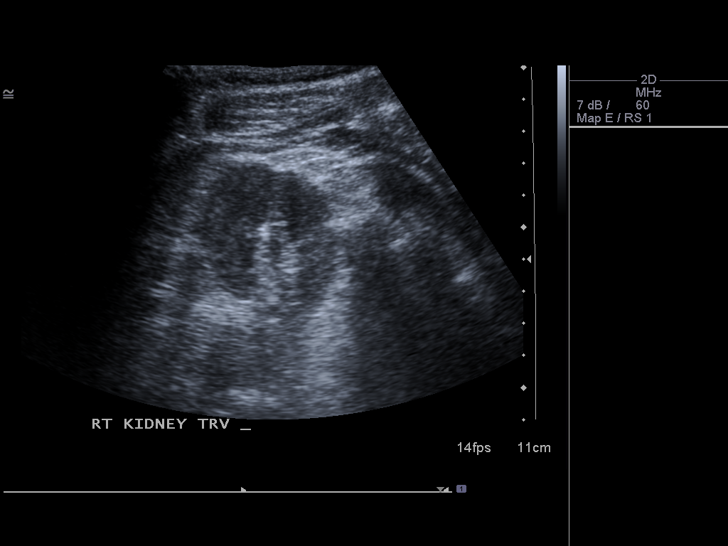
[im 16/23]
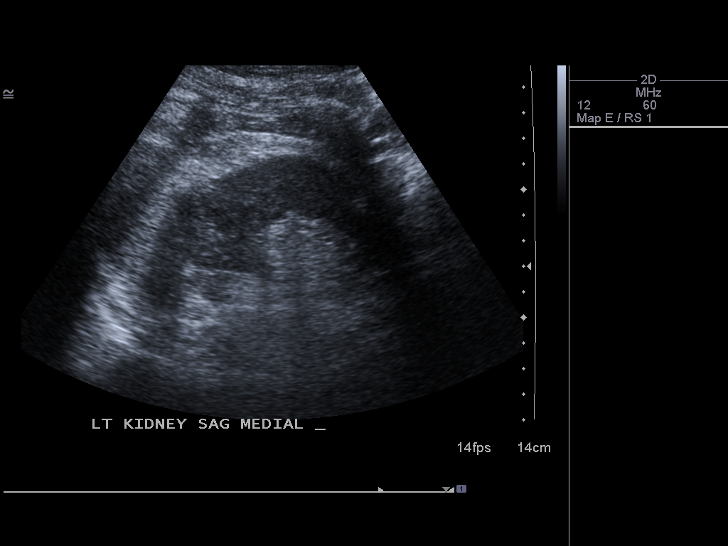
[im 18/23]
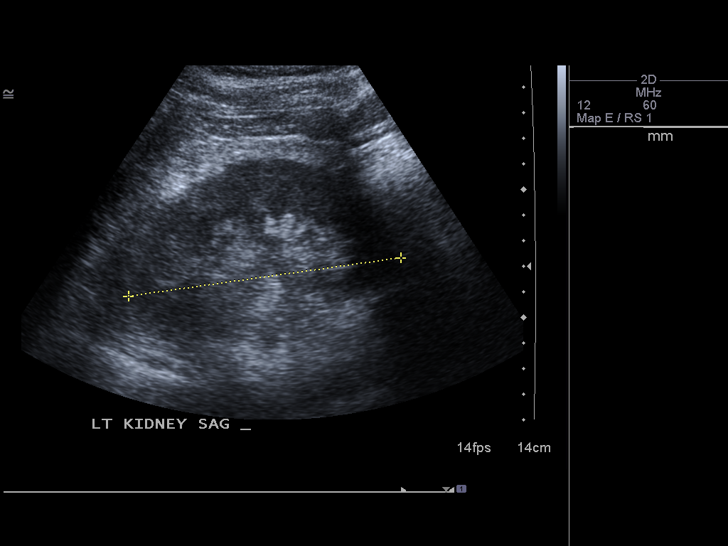
[im 19/23]
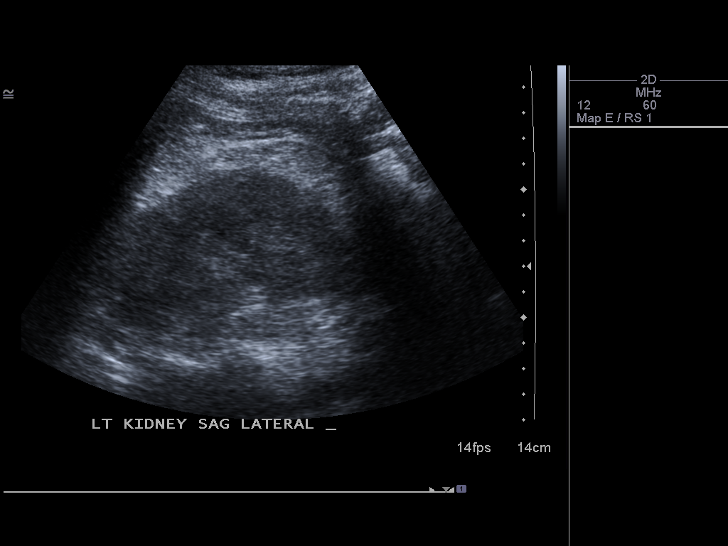
[im 21/23]
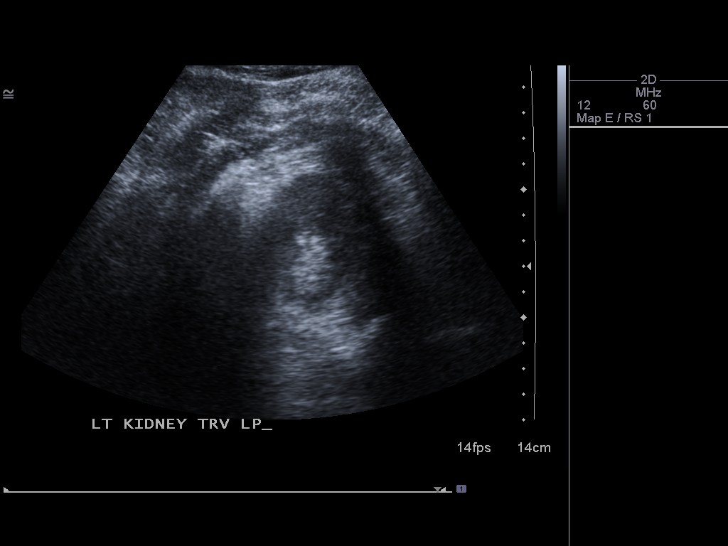
[im 23/23]
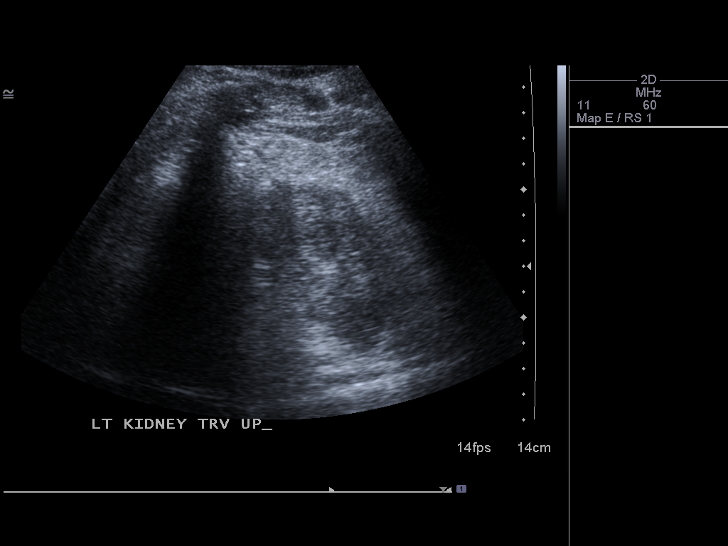

[14 of 23 positions shown; findings below may reference images not displayed]

FINDINGS: Right Kidney:  Right kidney is 7.8 cm in length.  No mass or
hydronephrosis.

Left Kidney:  Left kidney is 10.8 cm in length.  No mass or
hydronephrosis.

Bladder:  The bladder has a normal appearance for the state of
distension.
IMPRESSION: Negative exam.

## 2013-05-24 ENCOUNTER — Ambulatory Visit: Payer: Medicaid Other | Admitting: Neurology

## 2013-06-21 ENCOUNTER — Ambulatory Visit (INDEPENDENT_AMBULATORY_CARE_PROVIDER_SITE_OTHER): Payer: Medicaid Other | Admitting: Neurology

## 2013-06-21 ENCOUNTER — Encounter: Payer: Self-pay | Admitting: Neurology

## 2013-06-21 ENCOUNTER — Other Ambulatory Visit: Payer: Self-pay | Admitting: Neurology

## 2013-06-21 VITALS — BP 100/60 | HR 84 | Temp 97.9°F | Ht 65.0 in | Wt 147.0 lb

## 2013-06-21 DIAGNOSIS — F329 Major depressive disorder, single episode, unspecified: Secondary | ICD-10-CM

## 2013-06-21 DIAGNOSIS — Z87820 Personal history of traumatic brain injury: Secondary | ICD-10-CM | POA: Insufficient documentation

## 2013-06-21 DIAGNOSIS — R51 Headache: Secondary | ICD-10-CM

## 2013-06-21 DIAGNOSIS — G44329 Chronic post-traumatic headache, not intractable: Secondary | ICD-10-CM

## 2013-06-21 DIAGNOSIS — F419 Anxiety disorder, unspecified: Secondary | ICD-10-CM

## 2013-06-21 DIAGNOSIS — M542 Cervicalgia: Secondary | ICD-10-CM

## 2013-06-21 MED ORDER — AMITRIPTYLINE HCL 10 MG PO TABS: 10.0000 mg | ORAL_TABLET | Freq: Every day | ORAL | Status: DC
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
## 2013-06-21 NOTE — Patient Instructions (Addendum)
1.  We will try amitriptyline 10mg  at bedtime.  Side effects include sleepiness, dizziness, dry mouth or insomnia (more rare).  Call in 4 weeks with update. 2.  Referral to [facility]. 3.  PT of neck 4.  Follow up in 2 to 3 months. 5.  Stop tramadol and use Advil or Naproxen instead.

## 2013-07-05 ENCOUNTER — Ambulatory Visit: Payer: Medicaid Other

## 2013-07-08 ENCOUNTER — Ambulatory Visit: Payer: Medicaid Other | Attending: Neurology

## 2013-07-08 DIAGNOSIS — M542 Cervicalgia: Secondary | ICD-10-CM | POA: Insufficient documentation

## 2013-07-08 DIAGNOSIS — IMO0001 Reserved for inherently not codable concepts without codable children: Secondary | ICD-10-CM | POA: Insufficient documentation

## 2013-07-08 DIAGNOSIS — R5381 Other malaise: Secondary | ICD-10-CM | POA: Insufficient documentation

## 2013-07-08 DIAGNOSIS — R293 Abnormal posture: Secondary | ICD-10-CM | POA: Insufficient documentation

## 2013-07-08 DIAGNOSIS — R51 Headache: Secondary | ICD-10-CM | POA: Insufficient documentation

## 2013-09-20 ENCOUNTER — Ambulatory Visit: Payer: Medicaid Other | Admitting: Neurology

## 2013-09-30 ENCOUNTER — Telehealth: Payer: Self-pay | Admitting: Neurology

## 2013-09-30 ENCOUNTER — Encounter: Payer: Self-pay | Admitting: Neurology

## 2013-09-30 ENCOUNTER — Ambulatory Visit (INDEPENDENT_AMBULATORY_CARE_PROVIDER_SITE_OTHER): Payer: Medicaid Other | Admitting: Neurology

## 2013-09-30 VITALS — BP 98/64 | HR 72 | Temp 97.8°F | Resp 18 | Ht 65.0 in | Wt 143.9 lb

## 2013-09-30 DIAGNOSIS — F32A Depression, unspecified: Secondary | ICD-10-CM

## 2013-09-30 DIAGNOSIS — F3289 Other specified depressive episodes: Secondary | ICD-10-CM

## 2013-09-30 DIAGNOSIS — F329 Major depressive disorder, single episode, unspecified: Secondary | ICD-10-CM

## 2013-09-30 DIAGNOSIS — Z87820 Personal history of traumatic brain injury: Secondary | ICD-10-CM

## 2013-09-30 DIAGNOSIS — R413 Other amnesia: Secondary | ICD-10-CM

## 2013-09-30 DIAGNOSIS — R51 Headache: Secondary | ICD-10-CM

## 2013-09-30 DIAGNOSIS — R519 Headache, unspecified: Secondary | ICD-10-CM

## 2013-09-30 DIAGNOSIS — G4440 Drug-induced headache, not elsewhere classified, not intractable: Secondary | ICD-10-CM

## 2013-09-30 MED ORDER — NORTRIPTYLINE HCL 10 MG PO CAPS: 10.0000 mg | ORAL_CAPSULE | Freq: Every day | ORAL | Status: DC
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
## 2013-09-30 NOTE — Patient Instructions (Signed)
1.  Stop amitripyline.  Start nortriptyline 10mg  at bedtime.  Side effects include sleepiness, dizziness, dry mouth.  Call in 4 weeks with update. 2.  Stop fioricet.  May take tramadol but take no pain relievers more than 2 days out of the week. 3.  Follow up in 3 months.

## 2013-09-30 NOTE — Telephone Encounter (Signed)
CB# [id number], Judeth CornfieldStephanie w/ [facility]. Please call back regarding e-scribe / Sherri

## 2013-12-31 ENCOUNTER — Ambulatory Visit (INDEPENDENT_AMBULATORY_CARE_PROVIDER_SITE_OTHER): Payer: Medicaid Other | Admitting: Neurology

## 2013-12-31 ENCOUNTER — Encounter: Payer: Self-pay | Admitting: Neurology

## 2013-12-31 VITALS — BP 98/68 | HR 70 | Temp 97.7°F | Resp 16 | Ht 66.0 in | Wt 150.4 lb

## 2013-12-31 DIAGNOSIS — F32A Depression, unspecified: Secondary | ICD-10-CM

## 2013-12-31 DIAGNOSIS — G44321 Chronic post-traumatic headache, intractable: Secondary | ICD-10-CM

## 2013-12-31 DIAGNOSIS — F3289 Other specified depressive episodes: Secondary | ICD-10-CM

## 2013-12-31 DIAGNOSIS — F329 Major depressive disorder, single episode, unspecified: Secondary | ICD-10-CM

## 2013-12-31 DIAGNOSIS — G44329 Chronic post-traumatic headache, not intractable: Secondary | ICD-10-CM

## 2013-12-31 DIAGNOSIS — Z87820 Personal history of traumatic brain injury: Secondary | ICD-10-CM

## 2013-12-31 DIAGNOSIS — G4440 Drug-induced headache, not elsewhere classified, not intractable: Secondary | ICD-10-CM

## 2013-12-31 DIAGNOSIS — R413 Other amnesia: Secondary | ICD-10-CM

## 2013-12-31 MED ORDER — NORTRIPTYLINE HCL 25 MG PO CAPS: 25.0000 mg | ORAL_CAPSULE | Freq: Every day | ORAL | Status: DC

## 2013-12-31 MED ORDER — DICLOFENAC POTASSIUM 50 MG PO TABS: 50.0000 mg | ORAL_TABLET | Freq: Three times a day (TID) | ORAL | Status: AC | PRN

## 2013-12-31 NOTE — Patient Instructions (Signed)
1.  Increase nortriptyline to 25mg  daily.  2.  Stop tramadol.  Start diclofenac 50mg .  May take up to three times daily if needed, but do not exceed more than 1 day (2 days at most) per week to prevent rebound headache. 3.  CALL IN 4 WEEKS WITH UPDATE.  We can adjust medication at that time.  If we need to increase nortriptyline further, then I would recommend discontinuing Zoloft. 4.  Follow up in 3 months.
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
## 2014-03-08 ENCOUNTER — Other Ambulatory Visit: Payer: Self-pay | Admitting: Neurology

## 2014-03-27 ENCOUNTER — Other Ambulatory Visit: Payer: Self-pay | Admitting: Neurology

## 2014-04-01 ENCOUNTER — Ambulatory Visit: Payer: Medicaid Other | Admitting: Neurology

## 2014-04-02 ENCOUNTER — Telehealth: Payer: Self-pay | Admitting: Neurology

## 2014-04-02 NOTE — Telephone Encounter (Signed)
Pt no showed appt w/ Dr. Everlena CooperJaffe 04/01/14.  Alcario DroughtErica- please send no show letter / Oneita KrasSherri S.

## 2014-04-03 ENCOUNTER — Encounter: Payer: Self-pay | Admitting: *Deleted

## 2014-04-03 NOTE — Progress Notes (Unsigned)
No show letter sent for 04/01/2014 

## 2014-04-09 ENCOUNTER — Ambulatory Visit (INDEPENDENT_AMBULATORY_CARE_PROVIDER_SITE_OTHER): Payer: Medicaid Other | Admitting: Neurology

## 2014-04-09 ENCOUNTER — Encounter: Payer: Self-pay | Admitting: Neurology

## 2014-04-09 VITALS — BP 126/78 | HR 70 | Resp 16 | Wt 148.3 lb

## 2014-04-09 DIAGNOSIS — F32A Depression, unspecified: Secondary | ICD-10-CM

## 2014-04-09 DIAGNOSIS — F329 Major depressive disorder, single episode, unspecified: Secondary | ICD-10-CM

## 2014-04-09 DIAGNOSIS — Z87820 Personal history of traumatic brain injury: Secondary | ICD-10-CM

## 2014-04-09 DIAGNOSIS — R519 Headache, unspecified: Secondary | ICD-10-CM

## 2014-04-09 DIAGNOSIS — R51 Headache: Secondary | ICD-10-CM

## 2014-04-09 MED ORDER — NORTRIPTYLINE HCL 25 MG PO CAPS: 25.0000 mg | ORAL_CAPSULE | Freq: Every day | ORAL | Status: AC

## 2014-04-09 NOTE — Patient Instructions (Signed)
1.  Take nortriptyline 25mg  at bedtime.  Call in 4 weeks to let us know if the headaches are any better.  We can change dose if needed. 2.  Practice the tips for improving sleep 3.  Follow up in 6 months.
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
## 2014-04-10 NOTE — Progress Notes (Signed)
Note faxed to Dr Clarene DukeLittle at [phone] with confirmation received.

## 2014-04-22 ENCOUNTER — Telehealth: Payer: Self-pay | Admitting: *Deleted

## 2014-04-22 ENCOUNTER — Telehealth: Payer: Self-pay | Admitting: Neurology

## 2014-04-22 ENCOUNTER — Other Ambulatory Visit: Payer: Self-pay | Admitting: Neurology

## 2014-04-22 NOTE — Telephone Encounter (Signed)
Dr Everlena CooperJaffe Please advise on below call .

## 2014-04-22 NOTE — Telephone Encounter (Signed)
Pt called requesting to speak to a nurse regarding his meds refill. C/B [phone]

## 2014-04-22 NOTE — Telephone Encounter (Signed)
Archie Pattenonya, Pt's mother called f/u on the message from below.  Pt needs a refill for notriptyline. Pt's meds were taken by another person on the same household. C/b [phone]

## 2014-04-22 NOTE — Telephone Encounter (Signed)
patient called stating he has lost one half month of his Pamelor his prescription was given on 04/09/14 for number 30 I explaiined to him this could not be called in until the 30 days was up .

## 2014-04-23 ENCOUNTER — Telehealth: Payer: Self-pay | Admitting: *Deleted

## 2014-04-23 NOTE — Telephone Encounter (Signed)
Refill the nortriptyline (no refills)
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
## 2014-05-20 ENCOUNTER — Telehealth: Payer: Self-pay | Admitting: Neurology

## 2014-05-20 ENCOUNTER — Other Ambulatory Visit: Payer: Self-pay | Admitting: *Deleted

## 2014-05-20 MED ORDER — NORTRIPTYLINE HCL 25 MG PO CAPS: 25.0000 mg | ORAL_CAPSULE | Freq: Every day | ORAL | Status: AC

## 2014-05-20 NOTE — Telephone Encounter (Signed)
RX called to pharmacy [facility] [employer]

## 2014-05-20 NOTE — Telephone Encounter (Signed)
Pt needs to talk to someone about his medication he cant tell me anything but it is locked up in the barn so i dont know if he needs a refill or what please call [phone]

## 2014-10-03 ENCOUNTER — Other Ambulatory Visit: Payer: Self-pay | Admitting: Neurology

## 2014-10-10 ENCOUNTER — Ambulatory Visit: Payer: Medicaid Other | Admitting: Neurology

## 2014-10-13 ENCOUNTER — Encounter: Payer: Self-pay | Admitting: Neurology

## 2015-12-10 ENCOUNTER — Ambulatory Visit: Payer: Medicaid Other | Admitting: Neurology

## 2015-12-15 ENCOUNTER — Encounter: Payer: Self-pay | Admitting: Neurology

## 2015-12-17 ENCOUNTER — Telehealth: Payer: Self-pay | Admitting: Neurology

## 2015-12-17 NOTE — Telephone Encounter (Signed)
Patient dismissed from [facility] by Shon MilletAdam Jaffe, DO , effective June 13,2017. Dismissal letter sent out by certified / registered mail 12/17/15 [facility].

## 2016-01-01 NOTE — Telephone Encounter (Signed)
Certified dismissal letter returned as undeliverable, unclaimed, return to sender after three attempts by [employer]. Letter placed in another envelope and resent as 1st class mail which does not require a signature. 01/01/16  DAJ
